# Patient Record
Sex: Male | Born: 2005 | Race: White | Hispanic: No | Marital: Single | Smoking: Never smoker
Health system: Southern US, Community
[De-identification: ages and names within clinical notes are randomized; demographics above are authoritative.]

---
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
## 2023-03-11 DIAGNOSIS — X58XXXA Exposure to other specified factors, initial encounter: Secondary | ICD-10-CM | POA: Diagnosis not present

## 2023-03-11 DIAGNOSIS — S0012XA Contusion of left eyelid and periocular area, initial encounter: Secondary | ICD-10-CM | POA: Diagnosis not present

## 2023-03-11 DIAGNOSIS — Z0000 Encounter for general adult medical examination without abnormal findings: Secondary | ICD-10-CM | POA: Diagnosis not present

## 2023-03-11 DIAGNOSIS — H02844 Edema of left upper eyelid: Secondary | ICD-10-CM | POA: Diagnosis not present
